# Patient Record
Sex: Male | Born: 1960 | Race: Black or African American | Hispanic: No | Marital: Married | Smoking: Current some day smoker
Health system: Southern US, Community
[De-identification: ages and names within clinical notes are randomized; demographics above are authoritative.]

## PROBLEM LIST (undated history)

## (undated) DIAGNOSIS — S0590XA Unspecified injury of unspecified eye and orbit, initial encounter: Secondary | ICD-10-CM

---

## 2004-11-30 ENCOUNTER — Emergency Department: Payer: Self-pay | Admitting: Emergency Medicine

## 2012-08-17 ENCOUNTER — Emergency Department (HOSPITAL_COMMUNITY)
Admission: EM | Admit: 2012-08-17 | Discharge: 2012-08-17 | Disposition: A | Discharge status: Home / Self Care | Payer: Self-pay | Attending: Emergency Medicine | Admitting: Emergency Medicine

## 2012-08-17 ENCOUNTER — Emergency Department (HOSPITAL_COMMUNITY): Payer: Self-pay

## 2012-08-17 ENCOUNTER — Encounter (HOSPITAL_COMMUNITY): Payer: Self-pay | Admitting: Emergency Medicine

## 2012-08-17 DIAGNOSIS — J189 Pneumonia, unspecified organism: Secondary | ICD-10-CM | POA: Insufficient documentation

## 2012-08-17 DIAGNOSIS — F17200 Nicotine dependence, unspecified, uncomplicated: Secondary | ICD-10-CM | POA: Insufficient documentation

## 2012-08-17 HISTORY — DX: Unspecified injury of unspecified eye and orbit, initial encounter: S05.90XA

## 2012-08-17 LAB — CBC WITH DIFFERENTIAL/PLATELET
Basophils Absolute: 0 10*3/uL (ref 0.0–0.1)
Eosinophils Absolute: 0.4 10*3/uL (ref 0.0–0.7)
Eosinophils Relative: 3 % (ref 0–5)
MCH: 30.2 pg (ref 26.0–34.0)
MCV: 90.1 fL (ref 78.0–100.0)
Platelets: 281 10*3/uL (ref 150–400)
RDW: 12.8 % (ref 11.5–15.5)
WBC: 13 10*3/uL — ABNORMAL HIGH (ref 4.0–10.5)

## 2012-08-17 LAB — COMPREHENSIVE METABOLIC PANEL
ALT: 11 U/L (ref 0–53)
AST: 13 U/L (ref 0–37)
Calcium: 9.2 mg/dL (ref 8.4–10.5)
GFR calc Af Amer: >90 mL/min (ref >90)
Glucose, Bld: 107 mg/dL — ABNORMAL HIGH (ref 70–99)
Sodium: 138 mEq/L (ref 135–145)
Total Protein: 7.6 g/dL (ref 6.0–8.3)

## 2012-08-17 LAB — POCT I-STAT TROPONIN I

## 2012-08-17 MED ORDER — DEXTROSE 5 % IV SOLN
1.0000 g | Freq: Once | INTRAVENOUS | Status: AC
  Administered 2012-08-17: 1 g via INTRAVENOUS
  Filled 2012-08-17: qty 10

## 2012-08-17 MED ORDER — AZITHROMYCIN 250 MG PO TABS
500.0000 mg | ORAL_TABLET | Freq: Once | ORAL | Status: AC
  Administered 2012-08-17: 500 mg via ORAL
  Filled 2012-08-17: qty 2

## 2012-08-17 MED ORDER — ASPIRIN 81 MG PO CHEW
324.0000 mg | CHEWABLE_TABLET | Freq: Once | ORAL | Status: AC
  Administered 2012-08-17: 324 mg via ORAL
  Filled 2012-08-17: qty 4

## 2012-08-17 MED ORDER — AZITHROMYCIN 250 MG PO TABS: ORAL_TABLET | ORAL | Status: DC

## 2012-08-17 NOTE — ED Notes (Signed)
Family at bedside reports that the patient has been having "black outs" recently for an unknown period of time, but does state greater than 6 months.

## 2012-08-17 NOTE — ED Provider Notes (Addendum)
History  This chart was scribed for Joya Gaskins, MD by Erskine Emery. This patient was seen in room APA05/APA05 and the patient's care was started at 07:00.   CSN: [id number]  Arrival date & time 08/17/12  4098   First MD Initiated Contact with Patient 08/17/12 0700      Chief Complaint  Patient presents with  . Chest Pain     The history is provided by the patient. No language interpreter was used.  Austin Fowler is a 51 y.o. male who presents to the Emergency Department complaining of a stabbing chest pain, aggravated by deep breathing, that woke him up around 2 am this morning. Pt thought the pain was attributed to indigestion so he took some Tums and tried to go back to sleep. By the time the pt woke up again around 6 am, the pain had worsened. Pt took aspirin PTA and is in no acute pain currently. Pt reports a similar episode months ago that eased on its own, and some occasional episodes of diaphoresis and left arm numbness in the last 6 months, ever since his eye injury. Currently, the pt denies any syncope, diaphoresis, weakness or numbness in the extremities, cough, emesis, or pain or swelling in the legs. Pt has no h/o heart probelms, stroke, or DVT in extremities. 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 Pt denies any recent long distrance travel.  Past Medical History  Diagnosis Date  . Eye injury     History reviewed. No pertinent past surgical history.  No family history on file.  History  Substance Use Topics  . Smoking status: Not on file  . Smokeless tobacco: Not on file  . Alcohol Use:    Social history  - smoker   Review of Systems A complete 10 system review of systems was obtained and all systems are negative except as noted in the HPI and PMH.    Allergies  Review of patient's allergies indicates no known allergies.  Home Medications  No current outpatient prescriptions on file.  Triage Vitals: BP 121/76  Pulse 72   Temp 98.1 F (36.7 C) (Oral)  Resp 16  Ht 5\' 8"  (1.727 m)  Wt 165 lb (74.844 kg)  BMI 25.09 kg/m2  SpO2 97%  Physical Exam CONSTITUTIONAL: Well developed/well nourished HEAD AND FACE: Normocephalic/atraumatic EYES: evidence of previous left eye injury ENMT: Mucous membranes moist NECK: supple no meningeal signs SPINE:entire spine nontender CV: S1/S2 noted, no murmurs/rubs/gallops noted LUNGS: Coarse breath sounds in the left base. no apparent distress ABDOMEN: soft, nontender, no rebound or guarding GU:no cva tenderness NEURO: Pt is awake/alert, moves all extremitiesx4 EXTREMITIES: pulses normal, full ROM, no calf tenderness. SKIN: warm, color normal PSYCH: no abnormalities of mood noted   ED Course  Procedures  DIAGNOSTIC STUDIES: Oxygen Saturation is 97% on room air, adequate by my interpretation.    COORDINATION OF CARE: 06:45--Medication order: Aspirin chewable tablet, 324 mg--once  07:05--I evaluated the patient and we discussed a treatment plan including chest x-ray to which the pt agreed. I notified the pt that his EKG appears unremarkable. Pt denies the need for pain medication.  8:24 AM Pt resting comfortably i doubt pe.  He reports recent chills/fatigue, he had focal lung findings and abnormal CXR will treat for pneumonia Advised to quit smoking.  Will treat with antibiotics.  Advised repeat cxr in one month to evaluate for clearance of infiltrate.  Advised there could be nodule  on cxr that needs eval if repeat cxr is abnormal after abx.  Advised need to f/u with PCP - - he reports several months of "passing out" but none today,  Advised outpatient workup    Labs Reviewed  POCT I-STAT TROPONIN I  CBC WITH DIFFERENTIAL  COMPREHENSIVE METABOLIC PANEL     MDM  Nursing notes including past medical history and social history reviewed and considered in documentation xrays reviewed and considered Labs/vital reviewed and considered     Date: 08/17/2012   Rate: 71  Rhythm: normal sinus rhythm  QRS Axis: normal  Intervals: normal  ST/T Wave abnormalities: normal  Conduction Disutrbances:none  Narrative Interpretation:   Old EKG Reviewed: none available     I personally performed the services described in this documentation, which was scribed in my presence. The recorded information has been reviewed and considered.      Joya Gaskins, MD 08/17/12 4098  Joya Gaskins, MD 08/17/12 [phone]

## 2012-08-17 NOTE — ED Notes (Signed)
States that the pain is absent as long as he lies still, states the pain increases with movement and deep breathing.

## 2013-04-03 IMAGING — CR DG CHEST 1V PORT
1 series · 1 of 1 positions shown · non-contrast
Comparison: None.

CLINICAL DATA: 51-year-old male chest pain.

PORTABLE CHEST - 1 VIEW

[view not recorded]
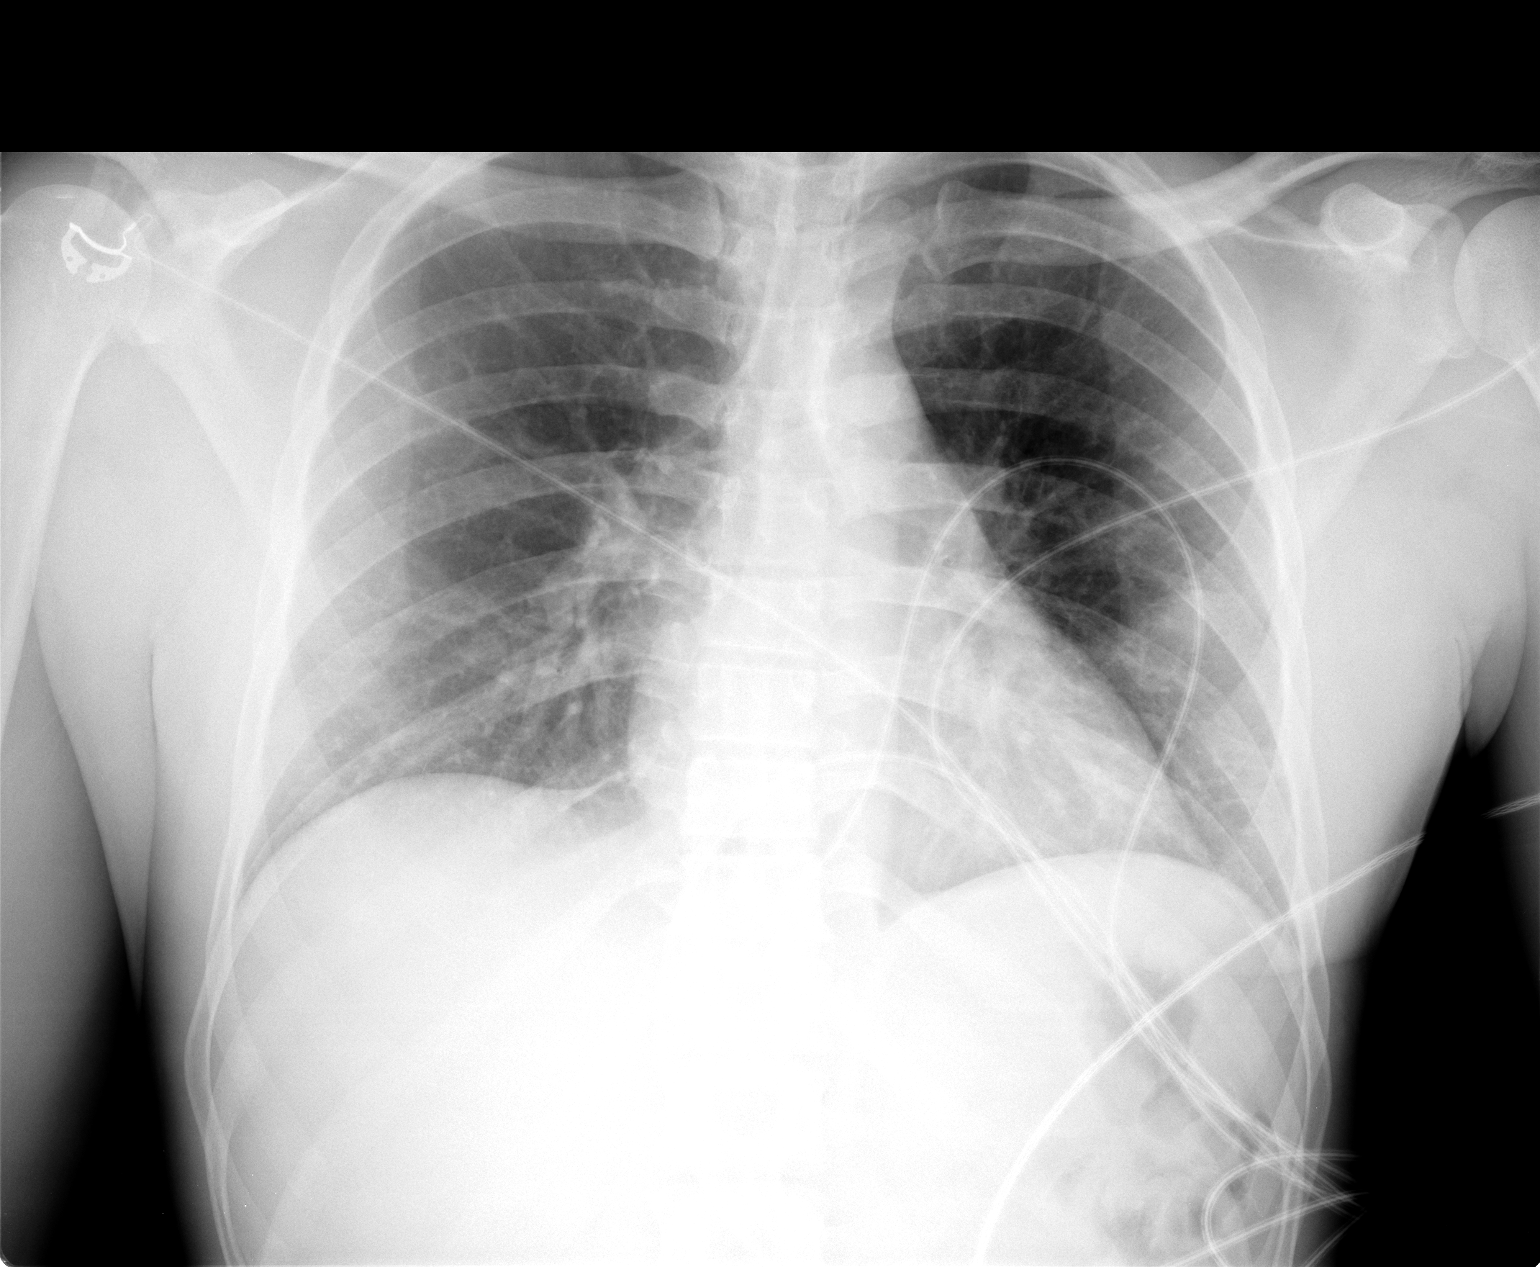

[1 of 1 positions shown; findings below may reference images not displayed]

FINDINGS: Semi upright AP portable view 4947 hours.  Slightly low
lung volumes for portable technique.  Cardiac size and mediastinal
contours are within normal limits.  Visualized tracheal air column
is within normal limits.  No pneumothorax or pleural effusion.  In
the left lung along the lower border of the scapula there is
suggestion of a superimposed 2 cm rounded density in the lung.
This could be artifact.  Elsewhere allowing for portable technique
the lungs are clear. No acute osseous abnormality identified.
IMPRESSION: 1.  Suggestion of a 2 cm mass-like opacity in the left mid lung,
but might be artifact related to the lower border of the scapula.
2.  Upright PA and lateral views of the chest recommended, and if
the density persists, chest CT with contrast then would be
indicated.

## 2013-06-13 ENCOUNTER — Emergency Department (HOSPITAL_COMMUNITY)
Admission: EM | Admit: 2013-06-13 | Discharge: 2013-06-13 | Disposition: A | Discharge status: Home / Self Care | Payer: Self-pay | Attending: Emergency Medicine | Admitting: Emergency Medicine

## 2013-06-13 ENCOUNTER — Encounter (HOSPITAL_COMMUNITY): Payer: Self-pay

## 2013-06-13 DIAGNOSIS — R5383 Other fatigue: Secondary | ICD-10-CM | POA: Insufficient documentation

## 2013-06-13 DIAGNOSIS — F17200 Nicotine dependence, unspecified, uncomplicated: Secondary | ICD-10-CM | POA: Insufficient documentation

## 2013-06-13 DIAGNOSIS — R5381 Other malaise: Secondary | ICD-10-CM | POA: Insufficient documentation

## 2013-06-13 DIAGNOSIS — R55 Syncope and collapse: Secondary | ICD-10-CM | POA: Insufficient documentation

## 2013-06-13 DIAGNOSIS — R531 Weakness: Secondary | ICD-10-CM

## 2013-06-13 DIAGNOSIS — R61 Generalized hyperhidrosis: Secondary | ICD-10-CM | POA: Insufficient documentation

## 2013-06-13 DIAGNOSIS — Z87828 Personal history of other (healed) physical injury and trauma: Secondary | ICD-10-CM | POA: Insufficient documentation

## 2013-06-13 LAB — CBC WITH DIFFERENTIAL/PLATELET
Eosinophils Absolute: 0.5 10*3/uL (ref 0.0–0.7)
Hemoglobin: 13.6 g/dL (ref 13.0–17.0)
Lymphocytes Relative: 31 % (ref 12–46)
Lymphs Abs: 3 10*3/uL (ref 0.7–4.0)
MCH: 31.3 pg (ref 26.0–34.0)
Monocytes Relative: 6 % (ref 3–12)
Neutro Abs: 5.4 10*3/uL (ref 1.7–7.7)
Neutrophils Relative %: 57 % (ref 43–77)
RBC: 4.35 MIL/uL (ref 4.22–5.81)
WBC: 9.6 10*3/uL (ref 4.0–10.5)

## 2013-06-13 LAB — BASIC METABOLIC PANEL
GFR calc non Af Amer: >90 mL/min (ref >90)
Glucose, Bld: 91 mg/dL (ref 70–99)
Potassium: 4 mEq/L (ref 3.5–5.1)
Sodium: 136 mEq/L (ref 135–145)

## 2013-06-13 LAB — TROPONIN I: Troponin I: <0.3 ng/mL (ref <0.30)

## 2013-06-13 LAB — CK: Total CK: 82 U/L (ref 7–232)

## 2013-06-13 NOTE — ED Notes (Signed)
Patient states he was working on a car and got hot, when he got home he felt fine then he got ready to take a shower and felt like he was going to pass out, fell over onto the bed and everything went black. Then he started feeling his body cool off.

## 2013-06-13 NOTE — ED Notes (Signed)
Last night everything went black on me, I got dizzy and my vision was blurry. Broke out into a sweat per pt. When I came to my body felt like it was cooling off from the sweat per pt.  Today I have been feeling weak and drained of energy per pt. It happened to me last year, but I did not check up on it per pt.

## 2013-06-13 NOTE — ED Notes (Signed)
Pt reports that he was working on a car yesterday evening. When he got home he became dizzy "everything faded out, and I laid on the bed for a few minutes and when I woke up I was in a pool of sweat cooling off". Reports that since then he has felt drained of energy. Denies any injury, N/V/D.

## 2013-06-13 NOTE — ED Provider Notes (Addendum)
History    This chart was scribed for Donnetta Hutching, MD, by Frederik Pear, ED scribe. The patient was seen in room APA03/APA03 and the patient's care was started at 1727.   CSN: [id number] Arrival date & time 06/13/13  1656  First MD Initiated Contact with Patient 06/13/13 1727     Chief Complaint  Patient presents with  . Fatigue  . Loss of Consciousness   (Consider location/radiation/quality/duration/timing/severity/associated sxs/prior Treatment) The history is provided by the patient and medical records. No language interpreter was used.   HPI Comments: Austin Fowler is a 52 y.o. male who presents to the Emergency Department complaining of a syncopal episode that occurred last night after he had been outside working on his car. He reports that he became hot and went inside to take a shower and was walking to the shower when the episode occurred. He denies hitting his head and states he fell onto his bed. He reports that he woke up 10-15 minutes later feeling weak and diaphoretic. He reports he went to bed hoping that he would wake up this morning feeling better, but reports continued weakness. He reports a possibility of dehydration when the symptoms began, but is unsure. Ambulatory since the episode. He denies SOB, CP, nausea, emesis, and diarrhea. He reports a h/o of similar episodes with the most recent occurring this past winter. He denies having seen a provider for previous episodes. No h/o of DM or hypertension. He is a current smoker who consumes 1 to 2 beers a day. 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  Past Medical History  Diagnosis Date  . Eye injury    History reviewed. No pertinent past surgical history. No family history on file. History  Substance Use Topics  . Smoking status: Current Some Day Smoker  . Smokeless tobacco: Not on file  . Alcohol Use: No    Review of Systems A complete 10 system review of systems was obtained and all systems are  negative except as noted in the HPI and PMH.  Allergies  Review of patient's allergies indicates no known allergies.  Home Medications   Current Outpatient Rx  Name  Route  Sig  Dispense  Refill  . azithromycin (ZITHROMAX) 250 MG tablet      One tablet PO daily for 4 days   4 tablet   0    BP 116/59  Pulse 73  Temp(Src) 98.3 F (36.8 C) (Oral)  Resp 18  SpO2 98% Physical Exam  Nursing note and vitals reviewed. Constitutional: He is oriented to person, place, and time. He appears well-developed and well-nourished.  HENT:  Head: Normocephalic and atraumatic.  Eyes: Conjunctivae and EOM are normal. Pupils are equal, round, and reactive to light.  Neck: Normal range of motion. Neck supple.  Cardiovascular: Normal rate, regular rhythm and normal heart sounds.   Pulmonary/Chest: Effort normal and breath sounds normal.  Abdominal: Soft. Bowel sounds are normal.  Musculoskeletal: Normal range of motion.  Neurological: He is alert and oriented to person, place, and time.  Skin: Skin is warm and dry.  Psychiatric: He has a normal mood and affect.    ED Course  Procedures (including critical care time)  DIAGNOSTIC STUDIES: Oxygen Saturation is 98% on room air, normal by my interpretation.    COORDINATION OF CARE:  20:45- Discussed planned course of treatment with the patient, including blood work and an EKG, who is agreeable at this time.  Results for orders  placed during the hospital encounter of 06/13/13  BASIC METABOLIC PANEL      Result Value Range   Sodium 136  135 - 145 mEq/L   Potassium 4.0  3.5 - 5.1 mEq/L   Chloride 100  96 - 112 mEq/L   CO2 28  19 - 32 mEq/L   Glucose, Bld 91  70 - 99 mg/dL   BUN 12  6 - 23 mg/dL   Creatinine, Ser 1.61  0.50 - 1.35 mg/dL   Calcium 9.0  8.4 - 09.6 mg/dL   GFR calc non Af Amer >90  >90 mL/min   GFR calc Af Amer >90  >90 mL/min  CBC WITH DIFFERENTIAL      Result Value Range   WBC 9.6  4.0 - 10.5 K/uL   RBC 4.35  4.22 - 5.81  MIL/uL   Hemoglobin 13.6  13.0 - 17.0 g/dL   HCT 04.5  40.9 - 81.1 %   MCV 91.0  78.0 - 100.0 fL   MCH 31.3  26.0 - 34.0 pg   MCHC 34.3  30.0 - 36.0 g/dL   RDW 91.4  78.2 - 95.6 %   Platelets 250  150 - 400 K/uL   Neutrophils Relative % 57  43 - 77 %   Neutro Abs 5.4  1.7 - 7.7 K/uL   Lymphocytes Relative 31  12 - 46 %   Lymphs Abs 3.0  0.7 - 4.0 K/uL   Monocytes Relative 6  3 - 12 %   Monocytes Absolute 0.6  0.1 - 1.0 K/uL   Eosinophils Relative 6 (*) 0 - 5 %   Eosinophils Absolute 0.5  0.0 - 0.7 K/uL   Basophils Relative 0  0 - 1 %   Basophils Absolute 0.0  0.0 - 0.1 K/uL  TROPONIN I      Result Value Range   Troponin I <0.30  <0.30 ng/mL  CK      Result Value Range   Total CK 82  7 - 232 U/L      Date: 06/13/2013  Rate: 67  Rhythm: normal sinus rhythm  QRS Axis: normal  Intervals: normal  ST/T Wave abnormalities: normal  Conduction Disutrbances: none  Narrative Interpretation: unremarkable    Labs Reviewed - No data to display No results found. No diagnosis found.  MDM  Screening blood work and EKG were normal. Patient has a normal physical exam. Vital signs stable.  I personally performed the services described in this documentation, which was scribed in my presence. The recorded information has been reviewed and is accurate.    Donnetta Hutching, MD 06/13/13 2130  Donnetta Hutching, MD 07/24/13 [phone]

## 2022-07-14 ENCOUNTER — Emergency Department: Payer: BC Managed Care – PPO

## 2022-07-14 ENCOUNTER — Emergency Department
Admission: EM | Admit: 2022-07-14 | Discharge: 2022-07-14 | Disposition: A | Discharge status: Home / Self Care | Payer: BC Managed Care – PPO | Attending: Emergency Medicine | Admitting: Emergency Medicine

## 2022-07-14 ENCOUNTER — Other Ambulatory Visit: Payer: Self-pay

## 2022-07-14 DIAGNOSIS — M5416 Radiculopathy, lumbar region: Secondary | ICD-10-CM | POA: Diagnosis not present

## 2022-07-14 DIAGNOSIS — R200 Anesthesia of skin: Secondary | ICD-10-CM | POA: Diagnosis present

## 2022-07-14 LAB — DIFFERENTIAL
Abs Immature Granulocytes: 0.02 10*3/uL (ref 0.00–0.07)
Basophils Absolute: 0.1 10*3/uL (ref 0.0–0.1)
Basophils Relative: 1 %
Eosinophils Absolute: 0.4 10*3/uL (ref 0.0–0.5)
Eosinophils Relative: 5 %
Immature Granulocytes: 0 %
Lymphocytes Relative: 25 %
Lymphs Abs: 2 10*3/uL (ref 0.7–4.0)
Monocytes Absolute: 0.6 10*3/uL (ref 0.1–1.0)
Monocytes Relative: 7 %
Neutro Abs: 5.1 10*3/uL (ref 1.7–7.7)
Neutrophils Relative %: 62 %

## 2022-07-14 LAB — COMPREHENSIVE METABOLIC PANEL
ALT: 12 U/L (ref 0–44)
AST: 20 U/L (ref 15–41)
Albumin: 4.1 g/dL (ref 3.5–5.0)
Alkaline Phosphatase: 70 U/L (ref 38–126)
Anion gap: 7 (ref 5–15)
BUN: 9 mg/dL (ref 6–20)
CO2: 26 mmol/L (ref 22–32)
Calcium: 8.9 mg/dL (ref 8.9–10.3)
Chloride: 104 mmol/L (ref 98–111)
Creatinine, Ser: 1.11 mg/dL (ref 0.61–1.24)
GFR, Estimated: >60 mL/min (ref >60)
Glucose, Bld: 101 mg/dL — ABNORMAL HIGH (ref 70–99)
Potassium: 4.1 mmol/L (ref 3.5–5.1)
Sodium: 137 mmol/L (ref 135–145)
Total Bilirubin: 1.9 mg/dL — ABNORMAL HIGH (ref 0.3–1.2)
Total Protein: 7.8 g/dL (ref 6.5–8.1)

## 2022-07-14 LAB — PROTIME-INR
INR: 1.1 (ref 0.8–1.2)
Prothrombin Time: 13.6 seconds (ref 11.4–15.2)

## 2022-07-14 LAB — CBC
HCT: 39.9 % (ref 39.0–52.0)
Hemoglobin: 13.2 g/dL (ref 13.0–17.0)
MCH: 30.6 pg (ref 26.0–34.0)
MCHC: 33.1 g/dL (ref 30.0–36.0)
MCV: 92.6 fL (ref 80.0–100.0)
Platelets: 272 10*3/uL (ref 150–400)
RBC: 4.31 MIL/uL (ref 4.22–5.81)
RDW: 12.5 % (ref 11.5–15.5)
WBC: 8.2 10*3/uL (ref 4.0–10.5)
nRBC: 0 % (ref 0.0–0.2)

## 2022-07-14 LAB — APTT: aPTT: 38 seconds — ABNORMAL HIGH (ref 24–36)

## 2022-07-14 LAB — CBG MONITORING, ED: Glucose-Capillary: 101 mg/dL — ABNORMAL HIGH (ref 70–99)

## 2022-07-14 MED ORDER — KETOROLAC TROMETHAMINE 30 MG/ML IJ SOLN
15.0000 mg | Freq: Once | INTRAMUSCULAR | Status: AC
  Administered 2022-07-14: 15 mg via INTRAVENOUS
  Filled 2022-07-14: qty 1

## 2022-07-14 MED ORDER — PREDNISONE 20 MG PO TABS: 40.0000 mg | ORAL_TABLET | Freq: Every day | ORAL | 0 refills | Status: AC

## 2022-07-14 MED ORDER — NAPROXEN 375 MG PO TABS: 375.0000 mg | ORAL_TABLET | Freq: Two times a day (BID) | ORAL | 0 refills | Status: AC | PRN

## 2022-07-14 MED ORDER — DEXAMETHASONE SODIUM PHOSPHATE 10 MG/ML IJ SOLN
10.0000 mg | Freq: Once | INTRAMUSCULAR | Status: AC
  Administered 2022-07-14: 10 mg via INTRAVENOUS
  Filled 2022-07-14: qty 1

## 2022-07-14 NOTE — Discharge Instructions (Signed)
Try to avoid any heavy lifting/twisting for the next 1 week  Take the steroids as prescribed and naproxen as needed for pain.  If symptoms do not resolve or if they return, I'd recommend seeing a spine specialist at the number provided.

## 2022-07-14 NOTE — ED Triage Notes (Signed)
Pt to ED via POV from home. Pt reports 2 days ago he woke up with left leg numbness/throbbing sensation that went away. Pt states left leg numbness/throbbing started again today at 6am while at work.   Pt denies visual changes, HA, CP, SOB. Facial symmetrical. Neuro exam intact, NIH 0. Pt is not on blood thinner.   Consulted with MD Malinda.  Pt is note a CODE Stroke but will do stroke workup.

## 2022-07-14 NOTE — ED Provider Notes (Signed)
[facility] Provider Note    Event Date/Time   First MD Initiated Contact with Patient 07/14/22 [phone]     (approximate)   History   Numbness (Left leg)   HPI  Austin Fowler is a 61 y.o. male  here with left leg numbness, throbbing sensation. Pt reports that several days ago, he experienced one to two days of a numbness sensation in his posterior L leg along with weakness, which he states was more due to aching, throbbing pain with movement. He states this resolved, but upon going to work today he began to feel a tingling/numbness in his R posterior leg, for which he presents today. No weakness today. No other complaints. No headache. No UE weakness or numbness. No h/o prior back injuries. No h/o stroke. 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 No fever, chills, weight loss. No loss of bowel or bladder function.     Physical Exam   Triage Vital Signs: ED Triage Vitals [07/14/22 0817]  Enc Vitals Group     BP 127/60     Pulse Rate 75     Resp 18     Temp 98.3 F (36.8 C)     Temp Source Oral     SpO2 98 %     Weight 160 lb (72.6 kg)     Height 5\' 8"  (1.727 m)     Head Circumference      Peak Flow      Pain Score 0     Pain Loc      Pain Edu?      Excl. in GC?     Most recent vital signs: Vitals:   07/14/22 0817 07/14/22 0900  BP: 127/60   Pulse: 75 65  Resp: 18   Temp: 98.3 F (36.8 C)   SpO2: 98% 99%     General: Awake, no distress.  CV:  Good peripheral perfusion.  Resp:  Normal effort.  Abd:  No distention.  Other:  Subjective paresthesias along posterior R leg, but strength 5/5, sensation fully intact on testing, and quad reflexes 1+ but symmetric. No clonus. No UE weakness or numbness. CNII-XII intact, with exception of L eye which is s/p injury and is blind.   ED Results / Procedures / Treatments   Labs (all labs ordered are listed, but only abnormal results are displayed) Labs Reviewed  APTT - Abnormal;  Notable for the following components:      Result Value   aPTT 38 (*)    All other components within normal limits  COMPREHENSIVE METABOLIC PANEL - Abnormal; Notable for the following components:   Glucose, Bld 101 (*)    Total Bilirubin 1.9 (*)    All other components within normal limits  CBG MONITORING, ED - Abnormal; Notable for the following components:   Glucose-Capillary 101 (*)    All other components within normal limits  PROTIME-INR  CBC  DIFFERENTIAL  CBG MONITORING, ED     EKG Normal sinus rhythm, VR 64. PR 172, QRS 84, QTc 420. No acute ST elevations or depressions. No ischemia or infarct.   RADIOLOGY CT Head: NAICA CT Lumbar Spine: Mild lumbar disc degeneration, most notabel L4-5 where there is central disc protrusion with mild stenosis and b/l lateral recess stenosis   I also independently reviewed and agree with radiologist interpretations.   PROCEDURES:  Critical Care performed: No   MEDICATIONS ORDERED IN ED: Medications  ketorolac (TORADOL) 30 MG/ML injection  15 mg (15 mg Intravenous Given 07/14/22 0917)  dexamethasone (DECADRON) injection 10 mg (10 mg Intravenous Given 07/14/22 0919)     IMPRESSION / MDM / ASSESSMENT AND PLAN / ED COURSE  I reviewed the triage vital signs and the nursing notes.                              Ddx:  Differential includes the following, with pertinent life- or limb-threatening emergencies considered:  Lumbar radiculopathy, central disc herniation, peripheral neuropathy, unlikely CVA, DVT, other MSk pain, electrolyte abnormality  Patient's presentation is most consistent with acute presentation with potential threat to life or bodily function.  MDM:  61 yo M with PMHx as above here with R posterior leg numbness, transient L numbness several days ago. Suspect lumbar radiculopathy with likely DDD. No UE weakness or numbness, and b/l sx are more c/w spinal etiology. He has NO loss of bowel or bladder, saddle anesthesia,  or signs of cauda equina clinically or on history. No fever, chills, or high risk/red flag sx to suggest epidural abscess or osteo. Abdomen is soft w/o signs of AAA. Labs reassuring as well - no leukocytosis, lytes WNL. CT head, L Spine as above, notable for L4-5 stenosis which fits with his sx.  Will give course of steroids, encourage outpt rehab and PCP f/u.No apparent emergent pathology/indication for admission at this time.   MEDICATIONS GIVEN IN ED: Medications  ketorolac (TORADOL) 30 MG/ML injection 15 mg (15 mg Intravenous Given 07/14/22 0917)  dexamethasone (DECADRON) injection 10 mg (10 mg Intravenous Given 07/14/22 0919)     Consults:     EMR reviewed       FINAL CLINICAL IMPRESSION(S) / ED DIAGNOSES   Final diagnoses:  Lumbar radiculopathy     Rx / DC Orders   ED Discharge Orders          Ordered    predniSONE (DELTASONE) 20 MG tablet  Daily        07/14/22 0953    naproxen (NAPROSYN) 375 MG tablet  2 times daily PRN        07/14/22 0953             Note:  This document was prepared using Dragon voice recognition software and may include unintentional dictation errors.   Shaune Pollack, MD 07/14/22 [phone]

## 2022-07-14 NOTE — ED Notes (Signed)
Pt placed on monitor in room. MD and primary RN aware pt is in room.

## 2022-07-26 NOTE — Progress Notes (Deleted)
Referring Physician:  No referring provider defined for this encounter.  Primary Physician:  Patient, No Pcp Per  History of Present Illness: 07/26/2022 Mr. Colie Fugitt is here today with a chief complaint of ***  He was seen in ED on 07/14/22 for left leg numbness/pain x 2 days that resolved. He started with right leg numbness/tingling in posterior leg earlier that morning.   He had CT of head and lumbar spine in ED. CT of head was normal. He was discharged from ED on 5 day course of prednisone (80mg  daily) and was given naproxen as well.       Duration: *** Location: *** Quality: *** Severity: ***  Precipitating: aggravated by *** Modifying factors: made better by *** Weakness: none Timing: *** Bowel/Bladder Dysfunction: none  Conservative measures:  Physical therapy: ***  Multimodal medical therapy including regular antiinflammatories: ***  Injections: *** epidural steroid injections  Past Surgery: ***  Misael Lemberger has ***no symptoms of cervical myelopathy.  The symptoms are causing a significant impact on the patient's life.   Review of Systems:  A 10 point review of systems is negative, except for the pertinent positives and negatives detailed in the HPI.  Past Medical History: Past Medical History:  Diagnosis Date   Eye injury     Past Surgical History: No past surgical history on file.  Allergies: Allergies as of 08/04/2022   (No Known Allergies)    Medications: No outpatient encounter medications on file as of 08/04/2022.   No facility-administered encounter medications on file as of 08/04/2022.    Social History: Social History   Tobacco Use   Smoking status: Some Days  Substance Use Topics   Alcohol use: No   Drug use: No    Family Medical History: No family history on file.  Physical Examination: There were no vitals filed for this visit.  General: Patient is well developed, well nourished, calm, collected, and in no  apparent distress. Attention to examination is appropriate.  Neck:   Supple.  ***Full range of motion.  Respiratory: Patient is breathing without any difficulty.   NEUROLOGICAL:     Awake, alert, oriented to person, place, and time.  Speech is clear and fluent. Fund of knowledge is appropriate.   Cranial Nerves: Pupils equal round and reactive to light.  Facial tone is symmetric.  Facial sensation is symmetric.  ROM of spine:  *** ROM of cervical spine *** pain *** ROM of lumbar spine *** pain  No abnormal lesions on exposed skin.   Strength: Side Biceps Triceps Deltoid Interossei Grip Wrist Ext. Wrist Flex.  R 5 5 5 5 5 5 5   L 5 5 5 5 5 5 5    Side Iliopsoas Quads Hamstring PF DF EHL  R 5 5 5 5 5 5   L 5 5 5 5 5 5    Reflexes are ***2+ and symmetric at the biceps, triceps, brachioradialis, patella and achilles.   Hoffman's is absent.  Clonus is not present.   Bilateral upper and lower extremity sensation is intact to light touch.    No evidence of dysmetria noted.  Gait is normal.   ***No difficulty with tandem gait.    Medical Decision Making  Imaging: CT of lumbar spine dated 07/14/22 IMPRESSION: 1. No acute osseous abnormality. 2. Mild lumbar disc degeneration, most notable at L4-5 where there is a central disc protrusion with mild spinal stenosis and bilateral lateral recess stenosis.     Electronically Signed   By:  Mosetta Putt M.D.   On: 07/14/2022 09:04  I have personally reviewed the images and agree with the above interpretation.  Assessment and Plan: Mr. Makin is a pleasant 61 y.o. male with ***  Above treatment options discussed with patient and following plan made:   - Order for physical therapy for *** spine ***. - Continue on current medications including ***. Reviewed proper dosing along with risks and benefits. Take and NSAIDs with food.    - Conservative therapy has been ineffective at relieving the symptoms. The patient would likely benefit  from surgical intervention.  We discussed **** along with the details of the procedure and the associated risks and benefits. Patient agrees to proceed with above surgery. Given the patients comorbidities, *he will need *** as well as a PAT appt.     I spent a total of *** minutes in face-to-face and non-face-to-face activities related to this patient's care today.  Thank you for involving me in the care of this patient.   Drake Leach PA-C Dept. of Neurosurgery

## 2022-08-04 ENCOUNTER — Ambulatory Visit: Payer: Self-pay | Admitting: Orthopedic Surgery
